# Patient Record
Sex: Male | Born: 1990 | Race: Black or African American | Hispanic: No | Marital: Single | Smoking: Current every day smoker
Health system: Southern US, Community
[De-identification: ages and names within clinical notes are randomized; demographics above are authoritative.]

## PROBLEM LIST (undated history)

## (undated) HISTORY — PX: HAND SURGERY: SHX662

---

## 2013-03-30 ENCOUNTER — Encounter (HOSPITAL_COMMUNITY): Payer: Self-pay

## 2013-03-30 ENCOUNTER — Emergency Department (HOSPITAL_COMMUNITY)
Admission: EM | Admit: 2013-03-30 | Discharge: 2013-03-30 | Disposition: A | Discharge status: Home / Self Care | Payer: Self-pay | Attending: Emergency Medicine | Admitting: Emergency Medicine

## 2013-03-30 DIAGNOSIS — F17200 Nicotine dependence, unspecified, uncomplicated: Secondary | ICD-10-CM | POA: Insufficient documentation

## 2013-03-30 DIAGNOSIS — R110 Nausea: Secondary | ICD-10-CM | POA: Insufficient documentation

## 2013-03-30 DIAGNOSIS — R42 Dizziness and giddiness: Secondary | ICD-10-CM | POA: Insufficient documentation

## 2013-03-30 DIAGNOSIS — G43909 Migraine, unspecified, not intractable, without status migrainosus: Secondary | ICD-10-CM

## 2013-03-30 DIAGNOSIS — G43809 Other migraine, not intractable, without status migrainosus: Secondary | ICD-10-CM | POA: Insufficient documentation

## 2013-03-30 DIAGNOSIS — Z79899 Other long term (current) drug therapy: Secondary | ICD-10-CM | POA: Insufficient documentation

## 2013-03-30 DIAGNOSIS — H538 Other visual disturbances: Secondary | ICD-10-CM | POA: Insufficient documentation

## 2013-03-30 DIAGNOSIS — H53149 Visual discomfort, unspecified: Secondary | ICD-10-CM | POA: Insufficient documentation

## 2013-03-30 MED ORDER — SODIUM CHLORIDE 0.9 % IV BOLUS (SEPSIS)
1000.0000 mL | Freq: Once | INTRAVENOUS | Status: AC
  Administered 2013-03-30: 1000 mL via INTRAVENOUS

## 2013-03-30 MED ORDER — METOCLOPRAMIDE HCL 10 MG PO TABS
10.0000 mg | ORAL_TABLET | Freq: Once | ORAL | Status: DC
  Filled 2013-03-30: qty 1

## 2013-03-30 MED ORDER — METOCLOPRAMIDE HCL 5 MG/ML IJ SOLN
10.0000 mg | Freq: Once | INTRAMUSCULAR | Status: AC
  Administered 2013-03-30: 10 mg via INTRAVENOUS
  Filled 2013-03-30: qty 2

## 2013-03-30 MED ORDER — ONDANSETRON HCL 4 MG PO TABS: 4.0000 mg | ORAL_TABLET | Freq: Four times a day (QID) | ORAL | Status: DC

## 2013-03-30 NOTE — ED Notes (Addendum)
Pt. Has been having intermittent headaches 2 -3 times a week since June.  In the last 2 weeks they have been with dizziness , nausea , blurred vision.  Headaches occur more while he is at work,  Today he had blurred vision with the headache that he has presently.   He works for [employer].    Allso has intermittent blurred vision with the headache, lasting a few seconds.  Pt. Took 4 ibuprofen prior to coming to ED pain is a 4/10.  Also takes Aleve

## 2013-03-30 NOTE — ED Provider Notes (Signed)
CSN: [id number]     Arrival date & time 03/30/13  1555 History   First MD Initiated Contact with Patient 03/30/13 1710     Chief Complaint  Patient presents with  . Headache   (Consider location/radiation/quality/duration/timing/severity/associated sxs/prior Treatment) The history is provided by the patient. No language interpreter was used.  Lance Chung is a 22 year old male presenting to emergency department with intermittent headaches is been ongoing for the past 2 weeks. Patient reported that these headaches have been worse while he is at work. Patient reports that while he is at work he is doing a lot of activities. 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 Patient reported that for the past 2 months he's been having these headaches intermittently, reported that every Saturday for the past 2 months he's been having these headaches. Patient reported that these headaches are behind the eyes described as a pulsating sensation with associated nausea, phonophobia, photophobia, sensitivity to smells. Patient reported that he experienced mildly blurred vision, reported that it was hard to focus for approximately 1 minute. Patient reports that the headaches are worse at work, bright lites, chemical smells. Patient reports that the headaches get better when he sits, relaxes, eats something. Patient reports that when he relaxes he feels a lot better. When asked about when being at home after work, patient reported that the headache is bearable and feels better. Denied worst headache of the life, sudden onset, weakness, vomiting, chest pain, shortness of breath, difficulty breathing, changes appetite, abdominal pain, melena, hematochezia, numbness, tingling, fall, head injury. PCP none  History reviewed. No pertinent past medical history. Past Surgical History  Procedure Laterality Date  . Hand surgery       rt.hand   History reviewed. No pertinent family history. History  Substance Use Topics  . Smoking status: Current Every Day Smoker    Types: Cigarettes  . Smokeless tobacco: Not on file  . Alcohol Use: Yes     Comment: occassional socially    Review of Systems  Constitutional: Negative for fever and chills.  HENT: Negative for sore throat, trouble swallowing, neck pain and neck stiffness.   Eyes: Positive for visual disturbance.  Respiratory: Negative for chest tightness and shortness of breath.   Cardiovascular: Negative for chest pain.  Gastrointestinal: Negative for nausea and vomiting.  Neurological: Positive for dizziness and headaches. Negative for weakness and numbness.  All other systems reviewed and are negative.    Allergies  Review of patient's allergies indicates no known allergies.  Home Medications   Current Outpatient Rx  Name  Route  Sig  Dispense  Refill  . acetaminophen (TYLENOL) 500 MG tablet   Oral   Take 1,500 mg by mouth every 6 (six) hours as needed (headache).         Marland Kitchen ibuprofen (ADVIL,MOTRIN) 200 MG tablet   Oral   Take 800 mg by mouth every 6 (six) hours as needed for headache.         . naproxen sodium (ANAPROX) 220 MG tablet   Oral   Take 440 mg by mouth daily as needed (headache).         . ondansetron (ZOFRAN) 4 MG tablet   Oral   Take 1 tablet (4 mg total) by mouth every 6 (six) hours.   12 tablet   0    BP 124/57  Pulse 59  Temp(Src) 98 F (36.7 C) (Oral)  Resp  18  Wt 239 lb (108.41 kg)  SpO2 100% Physical Exam  Nursing note and vitals reviewed. Constitutional: He is oriented to person, place, and time. He appears well-developed and well-nourished. No distress.  HENT:  Head: Normocephalic and atraumatic.  Mouth/Throat: Oropharynx is clear and moist. No oropharyngeal exudate.  Eyes: Conjunctivae and EOM are normal. Pupils are equal, round, and reactive to light. Right eye exhibits no discharge. Left eye exhibits  no discharge.  Negative nystagmus  Neck: Normal range of motion. Neck supple. No tracheal deviation present.  Negative neck stiffness Negative nuchal rigidity Negative cervical lymphadenopathy Negative upon palpation cervical spine  Cardiovascular: Normal rate, regular rhythm and normal heart sounds.  Exam reveals no friction rub.   No murmur heard. Pulses:      Radial pulses are 2+ on the right side, and 2+ on the left side.       Dorsalis pedis pulses are 2+ on the right side, and 2+ on the left side.  Pulmonary/Chest: Effort normal and breath sounds normal. No respiratory distress. He has no wheezes. He has no rales.  Lymphadenopathy:    He has no cervical adenopathy.  Neurological: He is alert and oriented to person, place, and time. No cranial nerve deficit. He exhibits normal muscle tone. Coordination normal.  Cranial nerves III through XII grossly intact Sensation intact upper and lower extremities bilaterally with differentiation to sharp and dull touch Strength 5+/5+ with resistance to upper and lower extremities bilaterally, equal distribution identified  Skin: Skin is warm and dry. No rash noted. He is not diaphoretic. No erythema.  Psychiatric: He has a normal mood and affect. His behavior is normal. Thought content normal.    ED Course  Procedures (including critical care time)  7:00 PM patient seen and assessed by Dr. Dub Mikes  8:01 PM Nurse reported that patient's headache has improved and that patient is ready to go. Patient reported that headache is gone.   Labs Review Labs Reviewed - No data to display Imaging Review No results found.  MDM   1. Migraine     Patient presenting to the emergency department with headache the been ongoing for the past 2 weeks, reported that these headaches are localized to the high in the eyes bilaterally with a pulsating sensation with association of nausea, phonophobia, photophobia, sensitivity to smells. Patient denied  history of headaches or migraines. Patient reported that he has been seeing a trend that over the past 2 months while working at [employer] he has been having these headaches intermittently. Patient alert and oriented. Upon arrival to the room patient sitting in darkness. Eyes normal, PERRLA bilaterally, negative findings, negative nystagmus. Cranial nerves grossly intact. Heart rate and rhythm normal. Lungs clear to auscultation. Full range of motion to upper and lower extremities bilaterally. Sensation intact. Pulses palpable. Strength intact. Negative neurological deficits identified. Negative meningeal signs. Suspicion to be migraines that patient has developed. Doubt SAH. Doubt meningitis. IV fluids and Reglan given in ED setting. Patient reported that he has been feeling better when given IV fluids and Reglan via IV. Patient seen and assessed by Dr. Dub Mikes, cleared patient for discharge. Patient stable, afebrile. Discharge patient with referral to health [facility]. Recommended patient to take over-the-counter Excedrin relief. Discussed with patient to stay hydrated while at work specialist this is been ongoing he sweats a lot. Discussed with patient to continue monitor symptoms and if symptoms are to worsen or change to report back to emergency department  immediately gastric return instructions given. Patient agreed to plan of care, understood, all questions answered.    Raymon Mutton, PA-C 04/01/13 [phone]

## 2013-03-30 NOTE — ED Provider Notes (Signed)
com[place]ains of headaches over the past several weeks left-sided temporal gradual onset accompanied by nausea photophobia, throbbing in nature. Nonradiating he treated himself with 4 ibuprofen tablets a day with significant relief.  Doug Sou, MD 03/30/13 [phone]

## 2013-04-02 NOTE — ED Provider Notes (Signed)
Medical screening examination/treatment/procedure(s) were conducted as a shared visit with non-physician practitioner(s) and myself.  I personally evaluated the patient during the encounter  Doug Sou, MD 04/02/13 [phone]

## 2013-04-05 ENCOUNTER — Encounter (HOSPITAL_COMMUNITY): Payer: Self-pay | Admitting: Emergency Medicine

## 2013-04-05 ENCOUNTER — Emergency Department (HOSPITAL_COMMUNITY)
Admission: EM | Admit: 2013-04-05 | Discharge: 2013-04-05 | Disposition: A | Discharge status: Home / Self Care | Payer: Self-pay | Attending: Emergency Medicine | Admitting: Emergency Medicine

## 2013-04-05 DIAGNOSIS — R112 Nausea with vomiting, unspecified: Secondary | ICD-10-CM | POA: Insufficient documentation

## 2013-04-05 DIAGNOSIS — F17200 Nicotine dependence, unspecified, uncomplicated: Secondary | ICD-10-CM | POA: Insufficient documentation

## 2013-04-05 DIAGNOSIS — R42 Dizziness and giddiness: Secondary | ICD-10-CM | POA: Insufficient documentation

## 2013-04-05 DIAGNOSIS — R51 Headache: Secondary | ICD-10-CM | POA: Insufficient documentation

## 2013-04-05 DIAGNOSIS — R1110 Vomiting, unspecified: Secondary | ICD-10-CM

## 2013-04-05 LAB — CBC WITH DIFFERENTIAL/PLATELET
Basophils Absolute: 0 10*3/uL (ref 0.0–0.1)
HCT: 44.7 % (ref 39.0–52.0)
Lymphocytes Relative: 35 % (ref 12–46)
Neutro Abs: 4.3 10*3/uL (ref 1.7–7.7)
Neutrophils Relative %: 57 % (ref 43–77)
Platelets: 256 10*3/uL (ref 150–400)
RDW: 12.5 % (ref 11.5–15.5)
WBC: 7.5 10*3/uL (ref 4.0–10.5)

## 2013-04-05 LAB — COMPREHENSIVE METABOLIC PANEL
Albumin: 4 g/dL (ref 3.5–5.2)
BUN: 11 mg/dL (ref 6–23)
Chloride: 102 mEq/L (ref 96–112)
Creatinine, Ser: 1.02 mg/dL (ref 0.50–1.35)
GFR calc non Af Amer: >90 mL/min (ref >90)
Total Bilirubin: 0.2 mg/dL — ABNORMAL LOW (ref 0.3–1.2)

## 2013-04-05 LAB — LIPASE, BLOOD: Lipase: 22 U/L (ref 11–59)

## 2013-04-05 MED ORDER — PROMETHAZINE HCL 25 MG PO TABS: 25.0000 mg | ORAL_TABLET | Freq: Four times a day (QID) | ORAL | Status: DC | PRN

## 2013-04-05 MED ORDER — PROMETHAZINE HCL 25 MG PO TABS
25.0000 mg | ORAL_TABLET | Freq: Once | ORAL | Status: AC
  Administered 2013-04-05: 25 mg via ORAL
  Filled 2013-04-05: qty 1

## 2013-04-05 NOTE — ED Provider Notes (Signed)
CSN: [id number]     Arrival date & time 04/05/13  1436 History   First MD Initiated Contact with Patient 04/05/13 1544     Chief Complaint  Patient presents with  . Emesis   (Consider location/radiation/quality/duration/timing/severity/associated sxs/prior Treatment) HPI Comments: Patient is a complaint of vomiting x4 last night and this morning. Patient has developed frequent headaches over the past 2 months. He is not currently having a headache. Patient was seen on 09/05 in emergency department for headache and had improvement with Reglan. He was discharged home with a prescription of Zofran which he cannot afford to fill. He was told to return to the emergency department if he had worsening vomiting and has done so today. When patient has headache, it is worse with position, bright lights. 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 He has been taking ibuprofen and Excedrin with resolution of headache. Patient has a followup appointment with primary care in one to 2 weeks. He denies fever, vision change, weakness in his arms or his legs. No difficulty walking. Sometimes the patient feels dizzy, but he has not passed out. The onset of this condition was acute. The course is constant. Aggravating factors: none. Alleviating factors: none.    The history is provided by the patient and medical records.    History reviewed. No pertinent past medical history. Past Surgical History  Procedure Laterality Date  . Hand surgery      rt.hand   History reviewed. No pertinent family history. History  Substance Use Topics  . Smoking status: Current Every Day Smoker    Types: Cigarettes  . Smokeless tobacco: Not on file  . Alcohol Use: Yes     Comment: occassional socially    Review of Systems  Constitutional: Negative for fever.  HENT: Negative for congestion, sore throat, rhinorrhea, neck pain, neck stiffness, dental  problem and sinus pressure.   Eyes: Negative for photophobia, discharge, redness and visual disturbance.  Respiratory: Negative for cough and shortness of breath.   Cardiovascular: Negative for chest pain.  Gastrointestinal: Positive for nausea and vomiting. Negative for abdominal pain and diarrhea.  Genitourinary: Negative for dysuria.  Musculoskeletal: Negative for myalgias and gait problem.  Skin: Negative for rash.  Neurological: Positive for dizziness and headaches. Negative for syncope, speech difficulty, weakness, light-headedness and numbness.  Psychiatric/Behavioral: Negative for confusion.    Allergies  Review of patient's allergies indicates no known allergies.  Home Medications   Current Outpatient Rx  Name  Route  Sig  Dispense  Refill  . aspirin-acetaminophen-caffeine (EXCEDRIN MIGRAINE) 250-250-65 MG per tablet   Oral   Take 1 tablet by mouth every 6 (six) hours as needed for pain.         Marland Kitchen ibuprofen (ADVIL,MOTRIN) 200 MG tablet   Oral   Take 600 mg by mouth every 6 (six) hours as needed for headache.          . ondansetron (ZOFRAN) 4 MG tablet   Oral   Take 1 tablet (4 mg total) by mouth every 6 (six) hours.   12 tablet   0    BP 155/73  Pulse 92  Temp(Src) 98.2 F (36.8 C) (Oral)  Resp 19  SpO2 98% Physical Exam  Nursing note and vitals reviewed. Constitutional: He is oriented to person, place, and time. He appears well-developed and well-nourished.  HENT:  Head: Normocephalic and atraumatic.  Eyes: Conjunctivae are normal.  Right eye exhibits no discharge. Left eye exhibits no discharge.  Neck: Normal range of motion. Neck supple.  Cardiovascular: Normal rate, regular rhythm and normal heart sounds.   Pulmonary/Chest: Effort normal and breath sounds normal.  Abdominal: Soft. There is no tenderness.  Neurological: He is alert and oriented to person, place, and time. He has normal strength and normal reflexes. No cranial nerve deficit or sensory  deficit. He exhibits normal muscle tone. Coordination and gait normal. GCS eye subscore is 4. GCS verbal subscore is 5. GCS motor subscore is 6.  Skin: Skin is warm and dry.  Psychiatric: He has a normal mood and affect.    ED Course  Procedures (including critical care time) Labs Review Labs Reviewed  COMPREHENSIVE METABOLIC PANEL - Abnormal; Notable for the following:    Glucose, Bld 100 (*)    Total Bilirubin 0.2 (*)    All other components within normal limits  LIPASE, BLOOD  CBC WITH DIFFERENTIAL   Imaging Review No results found.  4:27 PM Patient seen and examined. Work-up reviewed. Pt informed of results. Medications ordered.   Vital signs reviewed and are as follows: Filed Vitals:   04/05/13 1448  BP: 155/73  Pulse: 92  Temp: 98.2 F (36.8 C)  Resp: 19   Patient with improvement in nausea after Phenergan. He is drinking in room without vomiting. He is stable for discharge. I urged patient to keep his appointment with the health and wellness center.   Patient urged to return with worsening symptoms or other concerns. Patient verbalized understanding and agrees with plan.    MDM   1. Vomiting    Patient with recent headaches, none today. He's been vomiting last night and today. Unable to fill previous Zofran prescription. Improved in emergency department with supportive care. Blood work is reassuring. Will give Phenergan as this is inexpensive and may help improve his headaches. He has primary care followup planned. No neurological deficits. He appears well.    Renne Crigler, PA-C 04/05/13 1944

## 2013-04-05 NOTE — ED Notes (Signed)
Provider at bedside

## 2013-04-05 NOTE — ED Notes (Signed)
Pt c/o N/V; pt sts seen here recently for same and given RN for zofran but could not get filled; pt sts vomiting lst night and today

## 2013-04-07 NOTE — ED Provider Notes (Signed)
Medical screening examination/treatment/procedure(s) were performed by non-physician practitioner and as supervising physician I was immediately available for consultation/collaboration.    Celene Kras, MD 04/07/13 [phone]

## 2013-04-17 ENCOUNTER — Ambulatory Visit: Payer: Self-pay

## 2013-06-10 ENCOUNTER — Encounter (HOSPITAL_COMMUNITY): Payer: Self-pay | Admitting: Emergency Medicine

## 2013-06-10 ENCOUNTER — Emergency Department (HOSPITAL_COMMUNITY)
Admission: EM | Admit: 2013-06-10 | Discharge: 2013-06-10 | Disposition: A | Discharge status: Home / Self Care | Payer: Self-pay | Attending: Emergency Medicine | Admitting: Emergency Medicine

## 2013-06-10 DIAGNOSIS — S30863A Insect bite (nonvenomous) of scrotum and testes, initial encounter: Secondary | ICD-10-CM

## 2013-06-10 DIAGNOSIS — W57XXXA Bitten or stung by nonvenomous insect and other nonvenomous arthropods, initial encounter: Secondary | ICD-10-CM | POA: Insufficient documentation

## 2013-06-10 DIAGNOSIS — F17200 Nicotine dependence, unspecified, uncomplicated: Secondary | ICD-10-CM | POA: Insufficient documentation

## 2013-06-10 DIAGNOSIS — Y939 Activity, unspecified: Secondary | ICD-10-CM | POA: Insufficient documentation

## 2013-06-10 DIAGNOSIS — Y929 Unspecified place or not applicable: Secondary | ICD-10-CM | POA: Insufficient documentation

## 2013-06-10 DIAGNOSIS — S30860A Insect bite (nonvenomous) of lower back and pelvis, initial encounter: Secondary | ICD-10-CM | POA: Insufficient documentation

## 2013-06-10 NOTE — ED Notes (Signed)
The pt found a tick on his penis yesterday and when he pulled it off the area bled and he wanted the area checked.

## 2013-06-10 NOTE — ED Provider Notes (Signed)
CSN: [id number]     Arrival date & time 06/10/13  2054 History   First MD Initiated Contact with Patient 06/10/13 2111     Chief Complaint  Patient presents with  . tick bite    (Consider location/radiation/quality/duration/timing/severity/associated sxs/prior Treatment) HPI Comments: Patient noted a tick on his scrotum yesterday he removed it and had a small amount of bleeding that has now stopped At this time he can not tell where he was bitten but wanted it checked so he can resume intercourse with his partner   The history is provided by the patient.    History reviewed. No pertinent past medical history. Past Surgical History  Procedure Laterality Date  . Hand surgery      rt.hand   No family history on file. History  Substance Use Topics  . Smoking status: Current Every Day Smoker    Types: Cigarettes  . Smokeless tobacco: Not on file  . Alcohol Use: Yes     Comment: occassional socially    Review of Systems  Constitutional: Negative for fever and chills.  Musculoskeletal: Negative for myalgias.  Skin: Negative for rash and wound.  All other systems reviewed and are negative.    Allergies  Review of patient's allergies indicates no known allergies.  Home Medications   Current Outpatient Rx  Name  Route  Sig  Dispense  Refill  . aspirin-acetaminophen-caffeine (EXCEDRIN MIGRAINE) 250-250-65 MG per tablet   Oral   Take 1 tablet by mouth every 6 (six) hours as needed for pain.         Marland Kitchen ibuprofen (ADVIL,MOTRIN) 200 MG tablet   Oral   Take 600 mg by mouth every 6 (six) hours as needed for headache.          . promethazine (PHENERGAN) 25 MG tablet   Oral   Take 25 mg by mouth every 6 (six) hours as needed for nausea or vomiting.          BP 134/65  Pulse 84  Temp(Src) 99.8 F (37.7 C)  Resp 16  Ht 5\' 11"  (1.803 m)  Wt 240 lb (108.863 kg)  BMI 33.49 kg/m2  SpO2 98% Physical Exam  Nursing note and vitals reviewed. Constitutional: He appears  well-developed and well-nourished.  HENT:  Head: Normocephalic.  Eyes: Pupils are equal, round, and reactive to light.  Cardiovascular: Normal rate.   Pulmonary/Chest: Effort normal.  Genitourinary: Penis normal.  No redness, swelling   Skin: No erythema.    ED Course  Procedures (including critical care time) Labs Review Labs Reviewed - No data to display Imaging Review No results found.  EKG Interpretation   None       MDM   1. Tick bite of scrotum, initial encounter        Arman Filter, NP 06/10/13 2127

## 2013-06-13 NOTE — ED Provider Notes (Signed)
Medical screening examination/treatment/procedure(s) were performed by non-physician practitioner and as supervising physician I was immediately available for consultation/collaboration.    Celene Kras, MD 06/13/13 [phone]

## 2015-09-20 ENCOUNTER — Encounter (HOSPITAL_COMMUNITY): Payer: Self-pay | Admitting: Oncology

## 2015-09-20 ENCOUNTER — Emergency Department (HOSPITAL_COMMUNITY)
Admission: EM | Admit: 2015-09-20 | Discharge: 2015-09-20 | Disposition: A | Discharge status: Home / Self Care | Payer: 59 | Attending: Emergency Medicine | Admitting: Emergency Medicine

## 2015-09-20 ENCOUNTER — Emergency Department (HOSPITAL_COMMUNITY): Payer: 59

## 2015-09-20 DIAGNOSIS — J40 Bronchitis, not specified as acute or chronic: Secondary | ICD-10-CM

## 2015-09-20 DIAGNOSIS — F17210 Nicotine dependence, cigarettes, uncomplicated: Secondary | ICD-10-CM | POA: Diagnosis not present

## 2015-09-20 DIAGNOSIS — J209 Acute bronchitis, unspecified: Secondary | ICD-10-CM | POA: Insufficient documentation

## 2015-09-20 DIAGNOSIS — R05 Cough: Secondary | ICD-10-CM | POA: Diagnosis present

## 2015-09-20 DIAGNOSIS — Z79899 Other long term (current) drug therapy: Secondary | ICD-10-CM | POA: Diagnosis not present

## 2015-09-20 MED ORDER — BENZONATATE 100 MG PO CAPS: 100.0000 mg | ORAL_CAPSULE | Freq: Three times a day (TID) | ORAL | Status: AC

## 2015-09-20 MED ORDER — AZITHROMYCIN 250 MG PO TABS: 250.0000 mg | ORAL_TABLET | Freq: Every day | ORAL | Status: AC

## 2015-09-20 MED ORDER — GUAIFENESIN-CODEINE 100-10 MG/5ML PO SOLN
10.0000 mL | Freq: Once | ORAL | Status: AC
  Administered 2015-09-20: 10 mL via ORAL
  Filled 2015-09-20: qty 10

## 2015-09-20 MED ORDER — ALBUTEROL SULFATE HFA 108 (90 BASE) MCG/ACT IN AERS
2.0000 | INHALATION_SPRAY | RESPIRATORY_TRACT | Status: DC | PRN
  Administered 2015-09-20: 2 via RESPIRATORY_TRACT
  Filled 2015-09-20: qty 6.7

## 2015-09-20 MED ORDER — AZITHROMYCIN 250 MG PO TABS
500.0000 mg | ORAL_TABLET | Freq: Once | ORAL | Status: AC
  Administered 2015-09-20: 500 mg via ORAL
  Filled 2015-09-20: qty 2

## 2015-09-20 NOTE — Discharge Instructions (Signed)
Acute Bronchitis Bronchitis is inflammation of the airways that extend from the windpipe into the lungs (bronchi). The inflammation often causes mucus to develop. This leads to a cough, which is the most common symptom of bronchitis.  In acute bronchitis, the condition usually develops suddenly and goes away over time, usually in a couple weeks. Smoking, allergies, and asthma can make bronchitis worse. Repeated episodes of bronchitis may cause further lung problems.  CAUSES Acute bronchitis is most often caused by the same virus that causes a cold. The virus can spread from person to person (contagious) through coughing, sneezing, and touching contaminated objects. SIGNS AND SYMPTOMS   Cough.   Fever.   Coughing up mucus.   Body aches.   Chest congestion.   Chills.   Shortness of breath.   Sore throat.  DIAGNOSIS  Acute bronchitis is usually diagnosed through a physical exam. Your health care provider will also ask you questions about your medical history. Tests, such as chest X-rays, are sometimes done to rule out other conditions.  TREATMENT  Acute bronchitis usually goes away in a couple weeks. Oftentimes, no medical treatment is necessary. Medicines are sometimes given for relief of fever or cough. Antibiotic medicines are usually not needed but may be prescribed in certain situations. In some cases, an inhaler may be recommended to help reduce shortness of breath and control the cough. A cool mist vaporizer may also be used to help thin bronchial secretions and make it easier to clear the chest.  HOME CARE INSTRUCTIONS  Get plenty of rest.   Drink enough fluids to keep your urine clear or pale yellow (unless you have a medical condition that requires fluid restriction). Increasing fluids may help thin your respiratory secretions (sputum) and reduce chest congestion, and it will prevent dehydration.   Take medicines only as directed by your health care provider.  If  you were prescribed an antibiotic medicine, finish it all even if you start to feel better.  Avoid smoking and secondhand smoke. Exposure to cigarette smoke or irritating chemicals will make bronchitis worse. If you are a smoker, consider using nicotine gum or skin patches to help control withdrawal symptoms. Quitting smoking will help your lungs heal faster.   Reduce the chances of another bout of acute bronchitis by washing your hands frequently, avoiding people with cold symptoms, and trying not to touch your hands to your mouth, nose, or eyes.   Keep all follow-up visits as directed by your health care provider.  SEEK MEDICAL CARE IF: Your symptoms do not improve after 1 week of treatment.  SEEK IMMEDIATE MEDICAL CARE IF:  You develop an increased fever or chills.   You have chest pain.   You have severe shortness of breath.  You have bloody sputum.   You develop dehydration.  You faint or repeatedly feel like you are going to pass out.  You develop repeated vomiting.  You develop a severe headache. MAKE SURE YOU:   Understand these instructions.  Will watch your condition.  Will get help right away if you are not doing well or get worse.   This information is not intended to replace advice given to you by your health care provider. Make sure you discuss any questions you have with your health care provider.   Document Released: 08/19/2004 Document Revised: 08/02/2014 Document Reviewed: 01/02/2013 Elsevier Interactive Patient Education 2016 [employer]. Steps to Quit Smoking  Smoking tobacco can be harmful to your health and can affect almost every organ  in your body. Smoking puts you, and those around you, at risk for developing many serious chronic diseases. Quitting smoking is difficult, but it is one of the best things that you can do for your health. It is never too late to quit. WHAT ARE THE BENEFITS OF QUITTING SMOKING? When you quit smoking, you lower your  risk of developing serious diseases and conditions, such as:  Lung cancer or lung disease, such as COPD.  Heart disease.  Stroke.  Heart attack.  Infertility.  Osteoporosis and bone fractures. Additionally, symptoms such as coughing, wheezing, and shortness of breath may get better when you quit. You may also find that you get sick less often because your body is stronger at fighting off colds and infections. If you are pregnant, quitting smoking can help to reduce your chances of having a baby of low birth weight. HOW DO I GET READY TO QUIT? When you decide to quit smoking, create a plan to make sure that you are successful. Before you quit:  Pick a date to quit. Set a date within the next two weeks to give you time to prepare.  Write down the reasons why you are quitting. Keep this list in places where you will see it often, such as on your bathroom mirror or in your car or wallet.  Identify the people, places, things, and activities that make you want to smoke (triggers) and avoid them. Make sure to take these actions:  Throw away all cigarettes at home, at work, and in your car.  Throw away smoking accessories, such as Set designer.  Clean your car and make sure to empty the ashtray.  Clean your home, including curtains and carpets.  Tell your family, friends, and coworkers that you are quitting. Support from your loved ones can make quitting easier.  Talk with your health care provider about your options for quitting smoking.  Find out what treatment options are covered by your health insurance. WHAT STRATEGIES CAN I USE TO QUIT SMOKING?  Talk with your healthcare provider about different strategies to quit smoking. Some strategies include:  Quitting smoking altogether instead of gradually lessening how much you smoke over a period of time. Research shows that quitting "cold [place]" is more successful than gradually quitting.  Attending in-person counseling to  help you build problem-solving skills. You are more likely to have success in quitting if you attend several counseling sessions. Even short sessions of 10 minutes can be effective.  Finding resources and support systems that can help you to quit smoking and remain smoke-free after you quit. These resources are most helpful when you use them often. They can include:  Online chats with a Veterinary surgeon.  Telephone quitlines.  Printed Materials engineer.  Support groups or group counseling.  Text messaging programs.  Mobile phone applications.  Taking medicines to help you quit smoking. (If you are pregnant or breastfeeding, talk with your health care provider first.) Some medicines contain nicotine and some do not. Both types of medicines help with cravings, but the medicines that include nicotine help to relieve withdrawal symptoms. Your health care provider may recommend:  Nicotine patches, gum, or lozenges.  Nicotine inhalers or sprays.  Non-nicotine medicine that is taken by mouth. Talk with your health care provider about combining strategies, such as taking medicines while you are also receiving in-person counseling. Using these two strategies together makes you more likely to succeed in quitting than if you used either strategy on its own. If you  are pregnant or breastfeeding, talk with your health care provider about finding counseling or other support strategies to quit smoking. Do not take medicine to help you quit smoking unless told to do so by your health care provider. WHAT THINGS CAN I DO TO MAKE IT EASIER TO QUIT? Quitting smoking might feel overwhelming at first, but there is a lot that you can do to make it easier. Take these important actions:  Reach out to your family and friends and ask that they support and encourage you during this time. Call telephone quitlines, reach out to support groups, or work with a counselor for support.  Ask people who smoke to avoid smoking  around you.  Avoid places that trigger you to smoke, such as bars, parties, or smoke-break areas at work.  Spend time around people who do not smoke.  Lessen stress in your life, because stress can be a smoking trigger for some people. To lessen stress, try:  Exercising regularly.  Deep-breathing exercises.  Yoga.  Meditating.  Performing a body scan. This involves closing your eyes, scanning your body from head to toe, and noticing which parts of your body are particularly tense. Purposefully relax the muscles in those areas.  Download or purchase mobile phone or tablet apps (applications) that can help you stick to your quit plan by providing reminders, tips, and encouragement. There are many free apps, such as QuitGuide from the [employer] developer for Disease Control and Prevention). You can find other support for quitting smoking (smoking cessation) through smokefree.gov and other websites. HOW WILL I FEEL WHEN I QUIT SMOKING? Within the first 24 hours of quitting smoking, you may start to feel some withdrawal symptoms. These symptoms are usually most noticeable 2-3 days after quitting, but they usually do not last beyond 2-3 weeks. Changes or symptoms that you might experience include:  Mood swings.  Restlessness, anxiety, or irritation.  Difficulty concentrating.  Dizziness.  Strong cravings for sugary foods in addition to nicotine.  Mild weight gain.  Constipation.  Nausea.  Coughing or a sore throat.  Changes in how your medicines work in your body.  A depressed mood.  Difficulty sleeping (insomnia). After the first 2-3 weeks of quitting, you may start to notice more positive results, such as:  Improved sense of smell and taste.  Decreased coughing and sore throat.  Slower heart rate.  Lower blood pressure.  Clearer skin.  The ability to breathe more easily.  Fewer sick days. Quitting smoking is very challenging for most people. Do not get discouraged if  you are not successful the first time. Some people need to make many attempts to quit before they achieve long-term success. Do your best to stick to your quit plan, and talk with your health care provider if you have any questions or concerns.   This information is not intended to replace advice given to you by your health care provider. Make sure you discuss any questions you have with your health care provider.   Document Released: 07/06/2001 Document Revised: 11/26/2014 Document Reviewed: 11/26/2014 Elsevier Interactive Patient Education 2016 [employer]. Smoking Cessation, Tips for Success If you are ready to quit smoking, congratulations! You have chosen to help yourself be healthier. Cigarettes bring nicotine, tar, carbon monoxide, and other irritants into your body. Your lungs, heart, and blood vessels will be able to work better without these poisons. There are many different ways to quit smoking. Nicotine gum, nicotine patches, a nicotine inhaler, or nicotine nasal spray can help  with physical craving. Hypnosis, support groups, and medicines help break the habit of smoking. WHAT THINGS CAN I DO TO MAKE QUITTING EASIER?  Here are some tips to help you quit for good:  Pick a date when you will quit smoking completely. Tell all of your friends and family about your plan to quit on that date.  Do not try to slowly cut down on the number of cigarettes you are smoking. Pick a quit date and quit smoking completely starting on that day.  Throw away all cigarettes.   Clean and remove all ashtrays from your home, work, and car.  On a card, write down your reasons for quitting. Carry the card with you and read it when you get the urge to smoke.  Cleanse your body of nicotine. Drink enough water and fluids to keep your urine clear or pale yellow. Do this after quitting to flush the nicotine from your body.  Learn to predict your moods. Do not let a bad situation be your excuse to have a  cigarette. Some situations in your life might tempt you into wanting a cigarette.  Never have "just one" cigarette. It leads to wanting another and another. Remind yourself of your decision to quit.  Change habits associated with smoking. If you smoked while driving or when feeling stressed, try other activities to replace smoking. Stand up when drinking your coffee. Brush your teeth after eating. Sit in a different chair when you read the paper. Avoid alcohol while trying to quit, and try to drink fewer caffeinated beverages. Alcohol and caffeine may urge you to smoke.  Avoid foods and drinks that can trigger a desire to smoke, such as sugary or spicy foods and alcohol.  Ask people who smoke not to smoke around you.  Have something planned to do right after eating or having a cup of coffee. For example, plan to take a walk or exercise.  Try a relaxation exercise to calm you down and decrease your stress. Remember, you may be tense and nervous for the first 2 weeks after you quit, but this will pass.  Find new activities to keep your hands busy. Play with a pen, coin, or rubber band. Doodle or draw things on paper.  Brush your teeth right after eating. This will help cut down on the craving for the taste of tobacco after meals. You can also try mouthwash.   Use oral substitutes in place of cigarettes. Try using lemon drops, carrots, cinnamon sticks, or chewing gum. Keep them handy so they are available when you have the urge to smoke.  When you have the urge to smoke, try deep breathing.  Designate your home as a nonsmoking area.  If you are a heavy smoker, ask your health care provider about a prescription for nicotine chewing gum. It can ease your withdrawal from nicotine.  Reward yourself. Set aside the cigarette money you save and buy yourself something nice.  Look for support from others. Join a support group or smoking cessation program. Ask someone at home or at work to help you  with your plan to quit smoking.  Always ask yourself, "Do I need this cigarette or is this just a reflex?" Tell yourself, "Today, I choose not to smoke," or "I do not want to smoke." You are reminding yourself of your decision to quit.  Do not replace cigarette smoking with electronic cigarettes (commonly called e-cigarettes). The safety of e-cigarettes is unknown, and some may contain harmful chemicals.  If  you relapse, do not give up! Plan ahead and think about what you will do the next time you get the urge to smoke. HOW WILL I FEEL WHEN I QUIT SMOKING? You may have symptoms of withdrawal because your body is used to nicotine (the addictive substance in cigarettes). You may crave cigarettes, be irritable, feel very hungry, cough often, get headaches, or have difficulty concentrating. The withdrawal symptoms are only temporary. They are strongest when you first quit but will go away within 10-14 days. When withdrawal symptoms occur, stay in control. Think about your reasons for quitting. Remind yourself that these are signs that your body is healing and getting used to being without cigarettes. Remember that withdrawal symptoms are easier to treat than the major diseases that smoking can cause.  Even after the withdrawal is over, expect periodic urges to smoke. However, these cravings are generally short lived and will go away whether you smoke or not. Do not smoke! WHAT RESOURCES ARE AVAILABLE TO HELP ME QUIT SMOKING? Your health care provider can direct you to community resources or hospitals for support, which may include:  Group support.  Education.  Hypnosis.  Therapy.   This information is not intended to replace advice given to you by your health care provider. Make sure you discuss any questions you have with your health care provider.   Document Released: 04/09/2004 Document Revised: 08/02/2014 Document Reviewed: 12/28/2012 Elsevier Interactive Patient Education [employer].

## 2015-09-20 NOTE — ED Notes (Signed)
Pt c/o dry cough x 1 week.  Pt states that he has coughed so much his chest hurts when he coughs.  Pt has taken OTC cough medication w/o relief.

## 2015-09-20 NOTE — ED Provider Notes (Signed)
CSN: [id number]     Arrival date & time 09/20/15  4098 History   First MD Initiated Contact with Patient 09/20/15 [phone]     Chief Complaint  Patient presents with  . Cough     (Consider location/radiation/quality/duration/timing/severity/associated sxs/prior Treatment) HPI   Patient here with chest, head and rib pain that he feels when he coughs. The cough started 1 week ago. He had one episode of diarrhea earlier today. Healthy at baseline -no daily medications. No sore throat, ear pain, N/V. He tried Tylenol cold and flu alka seltzer. Not an asthmatic but current everyday smoker.  PCP: No PCP Per Patient  Lance Chung is a 25 y.o.  male  ROS: The patient denies diaphoresis, fever, headache, weakness (general or focal), confusion, change of vision,  dysphagia, aphagia, shortness of breath,  abdominal pains, nausea, vomiting,  lower extremity swelling, rash, neck pain,    History reviewed. No pertinent past medical history. Past Surgical History  Procedure Laterality Date  . Hand surgery      rt.[email or web address]   No family history on file. Social History  Substance Use Topics  . Smoking status: Current Every Day Smoker    Types: Cigarettes  . Smokeless tobacco: None  . Alcohol Use: Yes     Comment: occassional socially    Review of Systems  Review of Systems All other systems negative except as documented in the HPI. All pertinent positives and negatives as reviewed in the HPI.   Allergies  Review of patient's allergies indicates no known allergies.  Home Medications   Prior to Admission medications   Medication Sig Start Date End Date Taking? Authorizing Provider  Chlorphen-Phenyleph-ASA (ALKA-SELTZER PLUS COLD PO) Take 1-2 tablets by mouth every 8 (eight) hours as needed (for cold/flu).   Yes Historical Provider, MD  dextromethorphan-guaiFENesin (MUCINEX DM) 30-600 MG 12hr tablet Take 1 tablet by mouth 2 (two) times daily.   Yes Historical Provider, MD  ibuprofen  (ADVIL,MOTRIN) 200 MG tablet Take 600 mg by mouth every 6 (six) hours as needed for headache.    Yes Historical Provider, MD  pseudoephedrine-acetaminophen (TYLENOL SINUS) 30-500 MG TABS tablet Take 1 tablet by mouth every 4 (four) hours as needed (for cold).   Yes Historical Provider, MD  azithromycin (ZITHROMAX) 250 MG tablet Take 1 tablet (250 mg total) by mouth daily. Take first 2 tablets together, then 1 every day until finished. 09/20/15   Austina Constantin Neva Seat, PA-C  benzonatate (TESSALON) 100 MG capsule Take 1 capsule (100 mg total) by mouth every 8 (eight) hours. 09/20/15   Nolita Kutter Neva Seat, PA-C   BP 128/83 mmHg  Pulse 82  Temp(Src) 98 F (36.7 C) (Oral)  Resp 18  SpO2 100% Physical Exam  Constitutional: He appears well-developed and well-nourished. No distress.  HENT:  Head: Normocephalic and atraumatic.  Right Ear: Tympanic membrane and ear canal normal.  Left Ear: Tympanic membrane and ear canal normal.  Nose: Nose normal.  Mouth/Throat: Uvula is midline, oropharynx is clear and moist and mucous membranes are normal.  Eyes: Pupils are equal, round, and reactive to light.  Neck: Normal range of motion. Neck supple.  Cardiovascular: Normal rate and regular rhythm.   Pulmonary/Chest: Effort normal. He has no decreased breath sounds. He has no wheezes. He has rhonchi.  Large amount of non productive coughing.  Abdominal: Soft.  No signs of abdominal distention  Musculoskeletal:  No LE swelling  Neurological: He is alert.  Acting at baseline  Skin: Skin is warm and dry. No  rash noted.  Nursing note and vitals reviewed.   ED Course  Procedures (including critical care time) Labs Review Labs Reviewed - No data to display  Imaging Review Dg Chest 2 View  09/20/2015  CLINICAL DATA:  Cough for 1 week. EXAM: CHEST  2 VIEW COMPARISON:  None. FINDINGS: The cardiomediastinal contours are normal. The lungs are clear. Pulmonary vasculature is normal. No consolidation, pleural effusion, or  pneumothorax. Mild scoliotic curvature of the thoracic spine. No acute osseous abnormalities are seen. IMPRESSION: No acute pulmonary process. Electronically Signed   By: Rubye Oaks M.D.   On: 09/20/2015 03:55   I have personally reviewed and evaluated these images and lab results as part of my medical decision-making.   EKG Interpretation None      MDM   Final diagnoses:  Bronchitis   Pt symptoms consistent with URI.  CXR negative for acute infiltrate. Pt will be discharged with symptomatic treatment, cough medication and abx.  Discussed return precautions.  Pt is hemodynamically stable & in NAD prior to discharge.     Marlon Pel, PA-C 09/20/15 1610  Gilda Crease, MD 09/20/15 [phone]

## 2016-12-19 ENCOUNTER — Encounter (HOSPITAL_COMMUNITY): Payer: Self-pay | Admitting: *Deleted

## 2016-12-19 ENCOUNTER — Emergency Department (HOSPITAL_COMMUNITY)
Admission: EM | Admit: 2016-12-19 | Discharge: 2016-12-19 | Disposition: A | Discharge status: Home / Self Care | Payer: 59 | Attending: Emergency Medicine | Admitting: Emergency Medicine

## 2016-12-19 ENCOUNTER — Emergency Department (HOSPITAL_COMMUNITY): Payer: 59

## 2016-12-19 DIAGNOSIS — Z23 Encounter for immunization: Secondary | ICD-10-CM | POA: Insufficient documentation

## 2016-12-19 DIAGNOSIS — F17210 Nicotine dependence, cigarettes, uncomplicated: Secondary | ICD-10-CM | POA: Insufficient documentation

## 2016-12-19 DIAGNOSIS — Y999 Unspecified external cause status: Secondary | ICD-10-CM | POA: Insufficient documentation

## 2016-12-19 DIAGNOSIS — W25XXXA Contact with sharp glass, initial encounter: Secondary | ICD-10-CM | POA: Insufficient documentation

## 2016-12-19 DIAGNOSIS — Y929 Unspecified place or not applicable: Secondary | ICD-10-CM | POA: Insufficient documentation

## 2016-12-19 DIAGNOSIS — Y939 Activity, unspecified: Secondary | ICD-10-CM | POA: Insufficient documentation

## 2016-12-19 DIAGNOSIS — S61211A Laceration without foreign body of left index finger without damage to nail, initial encounter: Secondary | ICD-10-CM | POA: Insufficient documentation

## 2016-12-19 DIAGNOSIS — Z79899 Other long term (current) drug therapy: Secondary | ICD-10-CM | POA: Insufficient documentation

## 2016-12-19 DIAGNOSIS — Z792 Long term (current) use of antibiotics: Secondary | ICD-10-CM | POA: Insufficient documentation

## 2016-12-19 MED ORDER — IBUPROFEN 400 MG PO TABS
600.0000 mg | ORAL_TABLET | Freq: Once | ORAL | Status: AC
  Administered 2016-12-19: 600 mg via ORAL
  Filled 2016-12-19: qty 1

## 2016-12-19 MED ORDER — LIDOCAINE HCL (PF) 1 % IJ SOLN
10.0000 mL | Freq: Once | INTRAMUSCULAR | Status: AC
  Administered 2016-12-19: 10 mL via INTRADERMAL
  Filled 2016-12-19: qty 10

## 2016-12-19 MED ORDER — BACITRACIN ZINC 500 UNIT/GM EX OINT: TOPICAL_OINTMENT | Freq: Two times a day (BID) | CUTANEOUS | Status: DC

## 2016-12-19 MED ORDER — TETANUS-DIPHTH-ACELL PERTUSSIS 5-2.5-18.5 LF-MCG/0.5 IM SUSP
0.5000 mL | Freq: Once | INTRAMUSCULAR | Status: AC
  Administered 2016-12-19: 0.5 mL via INTRAMUSCULAR
  Filled 2016-12-19: qty 0.5

## 2016-12-19 MED ORDER — CEPHALEXIN 500 MG PO CAPS: 500.0000 mg | ORAL_CAPSULE | Freq: Four times a day (QID) | ORAL | 0 refills | Status: AC

## 2016-12-19 NOTE — ED Triage Notes (Signed)
Pt cut left hand on glass, has a small laceration to left anterior index finger, bandage applied at triage.

## 2016-12-19 NOTE — Discharge Instructions (Signed)
Keep wound and clean with mild soap and water and covered with a topical antibiotic ointment and bandage.   Take antibiotics as directed.   Ice and elevate for additional pain relief.   Alternate between Ibuprofen and Tylenol for additional pain relief.   [facility] or the Emergency Department in approximately 7 days for wound recheck and suture removal.   Monitor hand for signs of infection to include but not limited to increasing pain, redness, drainage, or swelling. Return to emergency department for emergent changing or worsening symptoms, redness/swelling that extends down the hand, fevers, or any other concerns.

## 2016-12-19 NOTE — ED Provider Notes (Signed)
[facility]-EMERGENCY DEPT Provider Note   CSN: [id number] Arrival date & time: 12/19/16  1526  By signing my name below, I, Lance Chung, attest that this documentation has been prepared under the direction and in the presence of Graciella Freer, PA-C.  Electronically Signed: Karren Cobble, ED Scribe. 12/19/16. 5:30 PM.  History   Chief Complaint Chief Complaint  Patient presents with  . Laceration   The history is provided by the patient. No language interpreter was used.    HPI Comments: Lance Chung is a 26 y.o. male with no pertinent PMHx, who presents to the Emergency Department complaining of a laceration to the left index finger that occurred at 1 pm when he cut it on a piece of glass. He reports associated pain that is worse with movement. Bleeding is controlled on ED arrival. Patient is unsure when his last tetanus shot was. No treatment tried PTA. Not currently on anticoagulants. Denies any numbness/weakness of the hand.   History reviewed. No pertinent past medical history.  There are no active problems to display for this patient.   Past Surgical History:  Procedure Laterality Date  . HAND SURGERY     rt.hand      Home Medications    Prior to Admission medications   Medication Sig Start Date End Date Taking? Authorizing Provider  azithromycin (ZITHROMAX) 250 MG tablet Take 1 tablet (250 mg total) by mouth daily. Take first 2 tablets together, then 1 every day until finished. 09/20/15   Marlon Pel, PA-C  benzonatate (TESSALON) 100 MG capsule Take 1 capsule (100 mg total) by mouth every 8 (eight) hours. 09/20/15   Marlon Pel, PA-C  cephALEXin (KEFLEX) 500 MG capsule Take 1 capsule (500 mg total) by mouth 4 (four) times daily. 12/19/16   Maxwell Caul, PA-C  Chlorphen-Phenyleph-ASA (ALKA-SELTZER PLUS COLD PO) Take 1-2 tablets by mouth every 8 (eight) hours as needed (for cold/flu).    [provider]  dextromethorphan-guaiFENesin (MUCINEX DM) 30-600 MG  12hr tablet Take 1 tablet by mouth 2 (two) times daily.    [provider]  ibuprofen (ADVIL,MOTRIN) 200 MG tablet Take 600 mg by mouth every 6 (six) hours as needed for headache.     [provider]  pseudoephedrine-acetaminophen (TYLENOL SINUS) 30-500 MG TABS tablet Take 1 tablet by mouth every 4 (four) hours as needed (for cold).    [provider]    Family History History reviewed. No pertinent family history.  Social History Social History  Substance Use Topics  . Smoking status: Current Every Day Smoker    Types: Cigarettes  . Smokeless tobacco: Not on file  . Alcohol use Yes     Comment: occassional socially     Allergies   Patient has no known allergies.   Review of Systems Review of Systems  Skin: Positive for wound.  Neurological: Negative for weakness and numbness.  All other systems reviewed and are negative.    Physical Exam Updated Vital Signs BP 132/79 (BP Location: Right Arm)   Pulse 80   Temp 98.8 F (37.1 C) (Oral)   Resp 18   SpO2 100%   Physical Exam  Constitutional: He appears well-developed and well-nourished.  Sitting comfortably on examination table  HENT:  Head: Normocephalic and atraumatic.  Eyes: Conjunctivae and EOM are normal. Right eye exhibits no discharge. Left eye exhibits no discharge. No scleral icterus.  Cardiovascular:  Pulses:      Radial pulses are 2+ on the right side, and 2+ on  the left side.  Pulmonary/Chest: Effort normal.  Musculoskeletal:  Full range of motion of left wrist. He is able to flex and extend all 5 digits of left hand without difficulty. The left index finger has full extension and flexion intact. He has full flexion of the DIP joint when held in isolation.  Neurological: He is alert.  Skin: Skin is warm and dry. Capillary refill takes less than 2 seconds.  2 cm laceration to the volar aspect of the left index finger, overlying the PIP area.   Psychiatric: He has a normal mood  and affect. His speech is normal and behavior is normal.  Nursing note and vitals reviewed.  .      ED Treatments / Results  .DIAGNOSTIC STUDIES: Oxygen Saturation is 100% on RA, normal by my interpretation.   COORDINATION OF CARE: 5:03 PM-Discussed next steps with pt. Pt verbalized understanding and is agreeable with the plan.   Labs (all labs ordered are listed, but only abnormal results are displayed) Labs Reviewed - No data to display  EKG  EKG Interpretation None       Radiology Dg Finger Index Left  Result Date: 12/19/2016 CLINICAL DATA:  26 year old male with history of laceration to the left index finger and to the third finger around the PIP joint. EXAM: LEFT INDEX FINGER 2+V COMPARISON:  No priors. FINDINGS: There is no evidence of fracture or dislocation. There is no evidence of arthropathy or other focal bone abnormality. Soft tissues are unremarkable. No retained radiopaque foreign body. IMPRESSION: Negative. Electronically Signed   By: Lance Chung  Entrikin M.D.   On: 12/19/2016 16:21    Procedures .Marland Kitchen.Laceration Repair Date/Time: 12/19/2016 6:21 PM Performed by: Graciella FreerLAYDEN, Fue Cervenka A Authorized by: Graciella FreerLAYDEN, Leisha Trinkle A   Consent:    Consent obtained:  Verbal   Consent given by:  Patient   Risks discussed:  Infection, retained foreign body and tendon damage   Alternatives discussed:  No treatment Anesthesia (see MAR for exact dosages):    Anesthesia method:  Local infiltration   Local anesthetic:  Lidocaine 1% w/o epi Laceration details:    Location:  Finger   Finger location:  L index finger   Length (cm):  2 Repair type:    Repair type:  Simple Pre-procedure details:    Preparation:  Patient was prepped and draped in usual sterile fashion Exploration:    Hemostasis achieved with:  Tourniquet   Wound exploration: wound explored through full range of motion   Treatment:    Area cleansed with:  Betadine   Amount of cleaning:  Extensive   Irrigation  solution:  Sterile saline   Irrigation method:  Syringe and tap   Visualized foreign bodies/material removed: no   Skin repair:    Repair method:  Sutures   Suture size:  5-0   Suture material:  Nylon   Number of sutures:  7 Approximation:    Approximation:  Close   Vermilion border: well-aligned   Post-procedure details:    Dressing:  Antibiotic ointment and non-adherent dressing   Patient tolerance of procedure:  Tolerated well, no immediate complications   (including critical care time)  Medications Ordered in ED Medications  bacitracin ointment (not administered)  lidocaine (PF) (XYLOCAINE) 1 % injection 10 mL (10 mLs Intradermal Given 12/19/16 1711)  Tdap (BOOSTRIX) injection 0.5 mL (0.5 mLs Intramuscular Given 12/19/16 1715)  ibuprofen (ADVIL,MOTRIN) tablet 600 mg (600 mg Oral Given 12/19/16 1711)     Initial Impression / Assessment and Plan /  ED Course  I have reviewed the triage vital signs and the nursing notes.  Pertinent labs & imaging results that were available during my care of the patient were reviewed by me and considered in my medical decision making (see chart for details).     26 year old male who presents with left index finger laceration that occurred at 1 PM. Patient is neurovascularly intact. He has full range of motion of left index. No evidence of tendon injury. X-rays ordered at triage for evaluation of foreign body or fracture. Patient provided analgesics in the department for pain relief. Tetanus updated in the department.  X-ray reviewed and negative for any acute fracture or dislocation or foreign body. We'll plan to repair laceration in the department.  Laceration repaired as documented above. Patient tolerated the procedure well. Instructed patient on general wound care. He is to return to the emergency department in 7 days for suture removal. Antibiotic coverage given. Strict return precautions discussed. Patient expresses understanding and agreement  to plan.  Final Clinical Impressions(s) / ED Diagnoses   Final diagnoses:  Laceration of left index finger without foreign body without damage to nail, initial encounter    New Prescriptions New Prescriptions   CEPHALEXIN (KEFLEX) 500 MG CAPSULE    Take 1 capsule (500 mg total) by mouth 4 (four) times daily.   I personally performed the services described in this documentation, which was scribed in my presence. The recorded information has been reviewed and is accurate.     Maxwell Caul, PA-C 12/19/16 1823    Tilden Fossa, MD 12/19/16 [phone]

## 2016-12-19 NOTE — ED Notes (Signed)
Pt st's he cut his finger on a glass.  Pt has small lac to left index finger

## 2016-12-19 NOTE — Progress Notes (Signed)
Orthopedic Tech Progress Note Patient Details:  Lance Chung 02/08/1991 [id number]  Ortho Devices Type of Ortho Device: Finger splint Ortho Device/Splint Location: LUE  Ortho Device/Splint Interventions: Ordered, Application   Jennye MoccasinHughes, Paityn Balsam Craig 12/19/2016, 6:41 PM

## 2016-12-19 NOTE — ED Notes (Signed)
Ortho called to apply finger splint

## 2016-12-31 ENCOUNTER — Emergency Department (HOSPITAL_COMMUNITY)
Admission: EM | Admit: 2016-12-31 | Discharge: 2016-12-31 | Disposition: A | Discharge status: Home / Self Care | Payer: Self-pay | Attending: Emergency Medicine | Admitting: Emergency Medicine

## 2016-12-31 ENCOUNTER — Encounter (HOSPITAL_COMMUNITY): Payer: Self-pay | Admitting: Emergency Medicine

## 2016-12-31 DIAGNOSIS — F17210 Nicotine dependence, cigarettes, uncomplicated: Secondary | ICD-10-CM | POA: Insufficient documentation

## 2016-12-31 DIAGNOSIS — Z4802 Encounter for removal of sutures: Secondary | ICD-10-CM | POA: Insufficient documentation

## 2016-12-31 NOTE — ED Provider Notes (Signed)
[facility]-EMERGENCY DEPT Provider Note   CSN: [id number] Arrival date & time: 12/31/16  1159  By signing my name below, I, Rosana Fret, attest that this documentation has been prepared under the direction and in the presence of non-physician practitioner, Audry Pili, PA-C. Electronically Signed: Rosana Fret, ED Scribe. 12/31/16. 12:24 PM.  History   Chief Complaint No chief complaint on file.  The history is provided by the patient. No language interpreter was used.  Suture / Staple Removal    HPI Comments: Lance Chung is a 26 y.o. male who presents to the Emergency Department complaining of a sudden onset, mild laceration to the left index finger that occurred 11 days ago. Pt reports he cut his finger on a piece of glass. Pt was seen in the ED on Dec 19, 2016 and had the laceration repaired with sutures. He is here today for removal of the sutures. No pain, redness or drainage from the area or any other complaints at this time.  No past medical history on file.  There are no active problems to display for this patient.   Past Surgical History:  Procedure Laterality Date  . HAND SURGERY     rt.hand       Home Medications    Prior to Admission medications   Medication Sig Start Date End Date Taking? Authorizing Provider  azithromycin (ZITHROMAX) 250 MG tablet Take 1 tablet (250 mg total) by mouth daily. Take first 2 tablets together, then 1 every day until finished. 09/20/15   Marlon Pel, PA-C  benzonatate (TESSALON) 100 MG capsule Take 1 capsule (100 mg total) by mouth every 8 (eight) hours. 09/20/15   Marlon Pel, PA-C  cephALEXin (KEFLEX) 500 MG capsule Take 1 capsule (500 mg total) by mouth 4 (four) times daily. 12/19/16   Maxwell Caul, PA-C  Chlorphen-Phenyleph-ASA (ALKA-SELTZER PLUS COLD PO) Take 1-2 tablets by mouth every 8 (eight) hours as needed (for cold/flu).    [provider]  dextromethorphan-guaiFENesin (MUCINEX DM) 30-600 MG 12hr tablet  Take 1 tablet by mouth 2 (two) times daily.    [provider]  ibuprofen (ADVIL,MOTRIN) 200 MG tablet Take 600 mg by mouth every 6 (six) hours as needed for headache.     [provider]  pseudoephedrine-acetaminophen (TYLENOL SINUS) 30-500 MG TABS tablet Take 1 tablet by mouth every 4 (four) hours as needed (for cold).    [provider]    Family History No family history on file.  Social History Social History  Substance Use Topics  . Smoking status: Current Every Day Smoker    Types: Cigarettes  . Smokeless tobacco: Not on file  . Alcohol use Yes     Comment: occassional socially     Allergies   Patient has no known allergies.   Review of Systems Review of Systems  Constitutional: Negative for fever.  Gastrointestinal: Negative for nausea.  Skin: Positive for wound.  Neurological: Negative for numbness.     Physical Exam Updated Vital Signs BP 117/66 (BP Location: Right Arm)   Pulse 68   Temp 98.3 F (36.8 C) (Oral)   Resp 16   SpO2 100%   Physical Exam  Constitutional: He is oriented to person, place, and time. He appears well-developed and well-nourished.  HENT:  Head: Normocephalic and atraumatic.  Cardiovascular: Normal rate.   Pulmonary/Chest: Effort normal.  Neurological: He is alert and oriented to person, place, and time.  Skin: Skin is warm and dry. Laceration noted.  Laceration to left  index finger. Well healed. No signs of infection. ROM normal. Cap refill < 2 seconds.   Psychiatric: He has a normal mood and affect.  Nursing note and vitals reviewed.  ED Treatments / Results  DIAGNOSTIC STUDIES: Oxygen Saturation is 100% on RA, normal by my interpretation.   COORDINATION OF CARE: 12:22 PM-Discussed next steps with pt including suture removal. Pt verbalized understanding and is agreeable with the plan.   Labs (all labs ordered are listed, but only abnormal results are displayed) Labs Reviewed - No data to  display  EKG  EKG Interpretation None       Radiology No results found.  Procedures Procedures (including critical care time)  Medications Ordered in ED Medications - No data to display   Initial Impression / Assessment and Plan / ED Course  I have reviewed the triage vital signs and the nursing notes.  Pertinent labs & imaging results that were available during my care of the patient were reviewed by me and considered in my medical decision making (see chart for details).  Final Clinical Impressions(s) / ED Diagnoses     {I have reviewed the relevant previous healthcare records.  {I obtained HPI from historian.   ED Course:  Assessment: Suture removal   Pt to ER for suture removal and wound check as above. Procedure tolerated well. Vitals normal, no signs of infection. Scar minimization & return precautions given at dc.   Disposition/Plan:  DC Home Additional Verbal discharge instructions given and discussed with patient.  Pt Instructed to f/u with PCP in the next week for evaluation and treatment of symptoms. Return precautions given Pt acknowledges and agrees with plan  Supervising Physician Tilden Fossaees, Elizabeth, MD  Final diagnoses:  Visit for suture removal    New Prescriptions New Prescriptions   No medications on file   I personally performed the services described in this documentation, which was scribed in my presence. The recorded information has been reviewed and is accurate.    Audry PiliMohr, Yatzil Clippinger, PA-C 12/31/16 1228    Tilden Fossaees, Elizabeth, MD 01/01/17 [phone]

## 2016-12-31 NOTE — ED Triage Notes (Addendum)
Here for suture removal from LEFT index finger. Sutures intact.

## 2017-03-06 ENCOUNTER — Encounter (HOSPITAL_COMMUNITY): Payer: Self-pay

## 2017-03-06 ENCOUNTER — Emergency Department (HOSPITAL_COMMUNITY)
Admission: EM | Admit: 2017-03-06 | Discharge: 2017-03-06 | Disposition: A | Discharge status: Home / Self Care | Payer: Self-pay | Attending: Emergency Medicine | Admitting: Emergency Medicine

## 2017-03-06 DIAGNOSIS — H18891 Other specified disorders of cornea, right eye: Secondary | ICD-10-CM | POA: Insufficient documentation

## 2017-03-06 DIAGNOSIS — F17210 Nicotine dependence, cigarettes, uncomplicated: Secondary | ICD-10-CM | POA: Insufficient documentation

## 2017-03-06 DIAGNOSIS — Z79899 Other long term (current) drug therapy: Secondary | ICD-10-CM | POA: Insufficient documentation

## 2017-03-06 MED ORDER — FLUORESCEIN SODIUM 0.6 MG OP STRP
1.0000 | ORAL_STRIP | Freq: Once | OPHTHALMIC | Status: AC
  Administered 2017-03-06: 1 via OPHTHALMIC

## 2017-03-06 MED ORDER — TETRACAINE HCL 0.5 % OP SOLN
1.0000 [drp] | Freq: Once | OPHTHALMIC | Status: AC
  Administered 2017-03-06: 1 [drp] via OPHTHALMIC
  Filled 2017-03-06: qty 4

## 2017-03-06 MED ORDER — ERYTHROMYCIN 5 MG/GM OP OINT
TOPICAL_OINTMENT | Freq: Once | OPHTHALMIC | Status: AC
  Administered 2017-03-06: 1 via OPHTHALMIC
  Filled 2017-03-06: qty 3.5

## 2017-03-06 NOTE — ED Provider Notes (Signed)
[facility]-EMERGENCY DEPT Provider Note   CSN: [id number] Arrival date & time: 03/06/17  1400   By signing my name below, I, Lance Chung, attest that this documentation has been prepared under the direction and in the presence of [facility], FNP. Electronically signed, Lance Chung, ED Scribe. 03/06/17. 3:44 PM.   History   Chief Complaint No chief complaint on file.  The history is provided by the patient and medical records. No language interpreter was used.    Lance Chung is a 26 y.o. male presenting to the Emergency Department concerning foreign body sensation in the R eye x 3 days. Associated itching, redness and eye watering. He states he was driving from the park with the windows down when he felt something get into his eye. He describes burning, constant, moderate pain that is worse with movement of/ application of pressure on the eye. Pt has applied eyedrops and water rinses with minimal relief. No h/o HTN, DM or heart disease. No other complaints at this time. Patient does not wear glasses or contact lenses.  History reviewed. No pertinent past medical history.  There are no active problems to display for this patient.   Past Surgical History:  Procedure Laterality Date  . HAND SURGERY     rt.hand       Home Medications    Prior to Admission medications   Medication Sig Start Date End Date Taking? Authorizing Provider  Chlorphen-Phenyleph-ASA (ALKA-SELTZER PLUS COLD PO) Take 1-2 tablets by mouth every 8 (eight) hours as needed (for cold/flu).   Yes [provider]  dextromethorphan-guaiFENesin (MUCINEX DM) 30-600 MG 12hr tablet Take 1 tablet by mouth 2 (two) times daily.   Yes [provider]  ibuprofen (ADVIL,MOTRIN) 200 MG tablet Take 600 mg by mouth every 6 (six) hours as needed for headache.    Yes [provider]  Naphazoline-Glycerin (CLEAR EYES MAX REDNESS RELIEF) 0.03-0.5 % SOLN Place 2 drops into the right eye 3 (three) times  daily as needed (irritation).   Yes [provider]  pseudoephedrine-acetaminophen (TYLENOL SINUS) 30-500 MG TABS tablet Take 1 tablet by mouth every 4 (four) hours as needed (for cold).   Yes [provider]  azithromycin (ZITHROMAX) 250 MG tablet Take 1 tablet (250 mg total) by mouth daily. Take first 2 tablets together, then 1 every day until finished. Patient not taking: Reported on 03/06/2017 09/20/15   Lance Pel, PA-C  benzonatate (TESSALON) 100 MG capsule Take 1 capsule (100 mg total) by mouth every 8 (eight) hours. Patient not taking: Reported on 03/06/2017 09/20/15   Lance Pel, PA-C  cephALEXin (KEFLEX) 500 MG capsule Take 1 capsule (500 mg total) by mouth 4 (four) times daily. Patient not taking: Reported on 03/06/2017 12/19/16   Lance Caul, PA-C    Family History No family history on file.  Social History Social History  Substance Use Topics  . Smoking status: Current Every Day Smoker    Types: Cigarettes  . Smokeless tobacco: Never Used  . Alcohol use Yes     Comment: occassional socially     Allergies   Patient has no known allergies.   Review of Systems Review of Systems  Constitutional: Negative for diaphoresis and fever.  HENT: Negative for ear discharge, ear pain and sore throat.   Eyes: Positive for pain, discharge, redness and itching.       Positive foreign body sensation  Respiratory: Negative for cough.   Gastrointestinal: Negative for nausea and vomiting.  Skin: Negative  for wound.  Neurological: Negative for headaches.  Psychiatric/Behavioral: The patient is not nervous/anxious.      Physical Exam Updated Vital Signs BP 124/67 (BP Location: Right Arm)   Pulse 77   Temp 98.2 F (36.8 C) (Oral)   Resp 14   Ht 5\' 11"  (1.803 m)   Wt 243 lb 8 oz (110.5 kg)   SpO2 95%   BMI 33.96 kg/m   Physical Exam  Constitutional: He appears well-developed and well-nourished. No distress.  HENT:  Head: Normocephalic.  Eyes:  Pupils are equal, round, and reactive to light. EOM and lids are normal. Right conjunctiva is injected.  Fundoscopic exam:      The right eye shows no exudate.  Slit lamp exam:      The right eye shows no corneal abrasion, no corneal ulcer and no foreign body.       The left eye shows no fluorescein uptake.  Neck: Neck supple.  Cardiovascular: Normal rate.   Pulmonary/Chest: Effort normal.  Abdominal: Soft. There is no tenderness.  Musculoskeletal: Normal range of motion.  Neurological: He is alert.  Skin: Skin is warm and dry.  Psychiatric: He has a normal mood and affect. His behavior is normal.  Nursing note and vitals reviewed.    ED Treatments / Results  DIAGNOSTIC STUDIES: Oxygen Saturation is 95% on RA, adequate by my interpretation.    COORDINATION OF CARE: 3:32 PM-Discussed next steps with pt. Pt verbalized understanding and is agreeable with the plan. Pt prepared for eye exam.   Labs (all labs ordered are listed, but only abnormal results are displayed) Labs Reviewed - No data to display  Radiology No results found.  Procedures Procedures (including critical care time)  Medications Ordered in ED Medications  erythromycin ophthalmic ointment (not administered)  tetracaine (PONTOCAINE) 0.5 % ophthalmic solution 1 drop (1 drop Right Eye Given 03/06/17 1534)  fluorescein ophthalmic strip 1 strip (1 strip Right Eye Given 03/06/17 1554)   26 y.o. male with right eye irritation stable for d/c without foreign body or corneal abrasion noted. Antibiotic eye ointment and f/u with opthalmology. Return precautions discussed   Initial Impression / Assessment and Plan / ED Course  I have reviewed the triage vital signs a Final Clinical Impressions(s) / ED Diagnoses   Final diagnoses:  Corneal irritation of right eye    New Prescriptions New Prescriptions   No medications on file  I personally performed the services described in this documentation, which was scribed in  my presence. The recorded information has been reviewed and is accurate.    Lance Chung, Lance Vlcek EtnaM, NP 03/06/17 1705    Lance Chung, Dan, DO 03/06/17 1713

## 2017-03-06 NOTE — Discharge Instructions (Signed)
Your exam today shows that you have irritation to the right eye. I did not see a foreign body on exam or a definite abrasion. We are treating you with antibiotic eye ointment and you will need to f/u with the ophthalmologist. Call tomorrow for a follow up appointment.  Use the eye ointment 3 times a day for the next week or until you follow up with the eye doctor.

## 2017-03-06 NOTE — ED Triage Notes (Signed)
Patient states he feeling there is something in his right eyes since Friday. Pt state he flush it out with water and put eye drop in but with no relief.

## 2017-03-17 ENCOUNTER — Emergency Department (HOSPITAL_COMMUNITY)
Admission: EM | Admit: 2017-03-17 | Discharge: 2017-03-18 | Discharge status: Against Medical Advice | Payer: Self-pay | Attending: Emergency Medicine | Admitting: Emergency Medicine

## 2017-03-17 DIAGNOSIS — Z5321 Procedure and treatment not carried out due to patient leaving prior to being seen by health care provider: Secondary | ICD-10-CM | POA: Insufficient documentation

## 2017-03-17 NOTE — ED Triage Notes (Signed)
Pt c/o a host of general symptoms such as loss of appetite generalized weakness insomina difficulty eating and N/V

## 2017-03-27 ENCOUNTER — Emergency Department (HOSPITAL_COMMUNITY)
Admission: EM | Admit: 2017-03-27 | Discharge: 2017-03-27 | Disposition: A | Discharge status: Home / Self Care | Payer: Self-pay | Attending: Emergency Medicine | Admitting: Emergency Medicine

## 2017-03-27 ENCOUNTER — Encounter (HOSPITAL_COMMUNITY): Payer: Self-pay | Admitting: *Deleted

## 2017-03-27 DIAGNOSIS — Z5321 Procedure and treatment not carried out due to patient leaving prior to being seen by health care provider: Secondary | ICD-10-CM | POA: Insufficient documentation

## 2017-03-27 DIAGNOSIS — K625 Hemorrhage of anus and rectum: Secondary | ICD-10-CM | POA: Insufficient documentation

## 2017-03-27 NOTE — ED Triage Notes (Signed)
Patient is alert and oriented x4.  He is being seen for rectal bleeding that started last night.  Patient states that he has had blood in his emesis in the past and that the rectal bleeding was new and started last night.  Patient describes frank red blood when he wipes and some blood in the stool,  Currently he rates his pain 3 of 10.

## 2017-03-27 NOTE — ED Notes (Signed)
Called for room placement no answer 

## 2017-10-14 IMAGING — DX DG FINGER INDEX 2+V*L*
4 series · 4 of 4 positions shown · non-contrast
Comparison: No priors.

CLINICAL DATA: 18-year-old male with history of laceration to the
left index finger and to the third finger around the PIP joint.

EXAM:
LEFT INDEX FINGER 2+V

[finger obl]
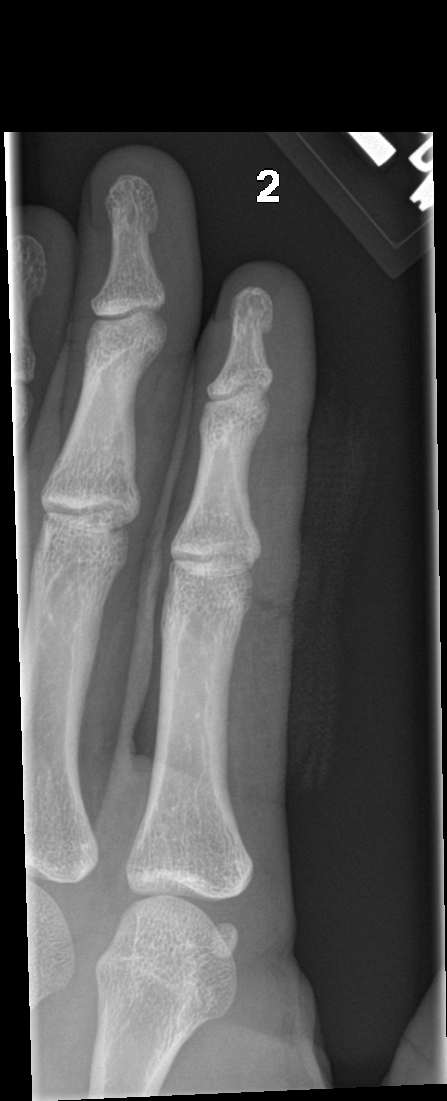

[finger lat (1 of 2)]
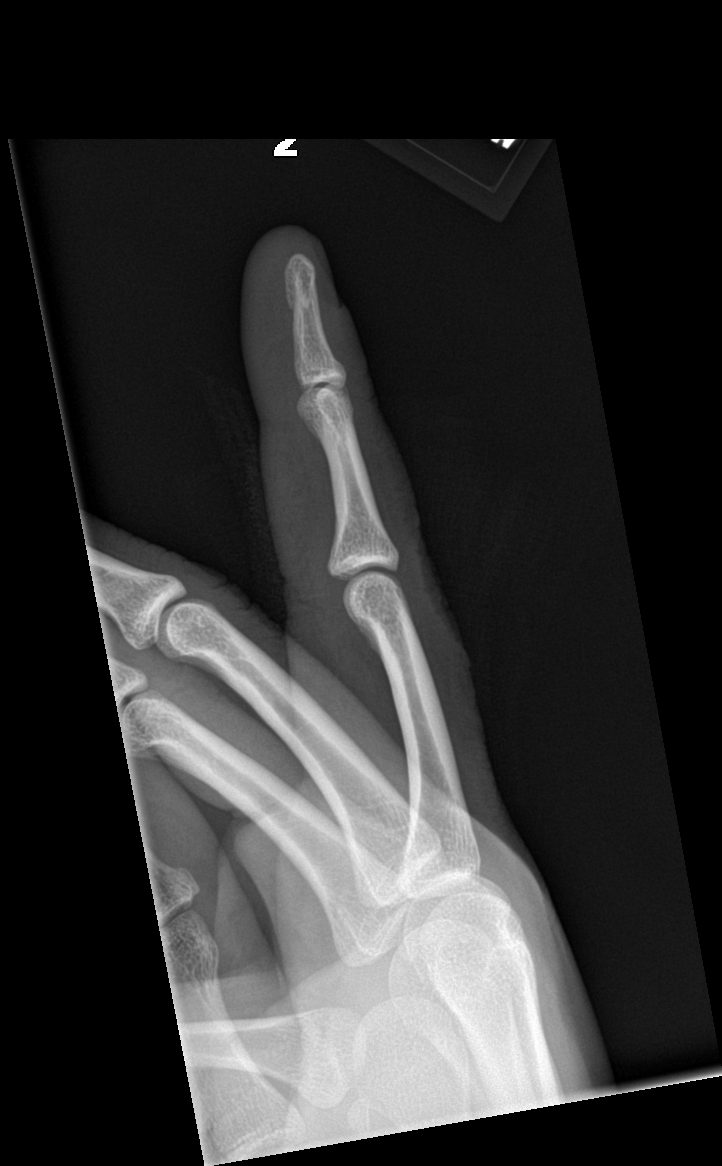

[finger ap]
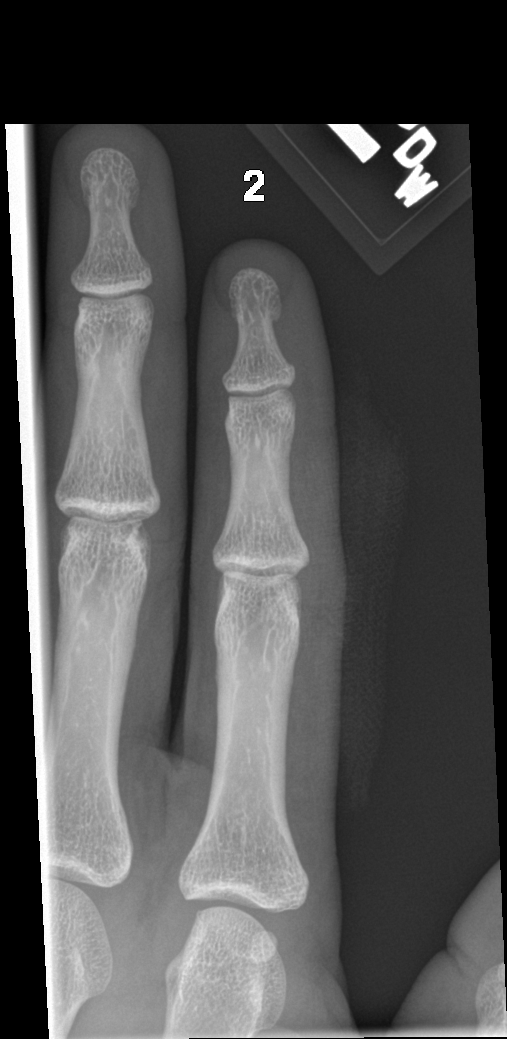

[finger lat (2 of 2)]
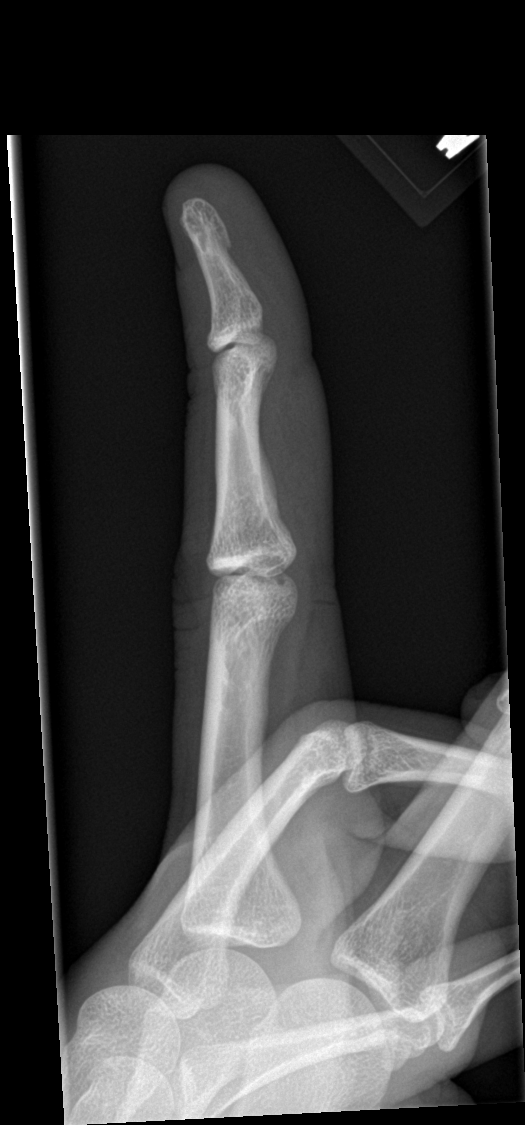

[4 of 4 positions shown; findings below may reference images not displayed]

FINDINGS: There is no evidence of fracture or dislocation. There is no
evidence of arthropathy or other focal bone abnormality. Soft
tissues are unremarkable. No retained radiopaque foreign body.
IMPRESSION: Negative.
